# Patient Record
Sex: Male | Born: 1995 | Race: White | Hispanic: No | Marital: Single | Smoking: Never smoker
Health system: Southern US, Community
[De-identification: ages and names within clinical notes are randomized; demographics above are authoritative.]

## PROBLEM LIST (undated history)

## (undated) DIAGNOSIS — M0800 Unspecified juvenile rheumatoid arthritis of unspecified site: Secondary | ICD-10-CM

## (undated) DIAGNOSIS — K589 Irritable bowel syndrome without diarrhea: Secondary | ICD-10-CM

## (undated) HISTORY — PX: NO PAST SURGERIES: SHX2092

---
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
## 2023-10-31 ENCOUNTER — Ambulatory Visit: Payer: Self-pay | Admitting: Cardiology

## 2023-11-06 ENCOUNTER — Ambulatory Visit: Payer: PRIVATE HEALTH INSURANCE | Attending: Cardiology

## 2023-11-06 ENCOUNTER — Encounter: Payer: Self-pay | Admitting: Cardiology

## 2023-11-06 ENCOUNTER — Ambulatory Visit (INDEPENDENT_AMBULATORY_CARE_PROVIDER_SITE_OTHER): Payer: PRIVATE HEALTH INSURANCE

## 2023-11-06 ENCOUNTER — Ambulatory Visit: Payer: PRIVATE HEALTH INSURANCE | Attending: Cardiology | Admitting: Cardiology

## 2023-11-06 VITALS — BP 144/90 | HR 98 | Ht 70.0 in | Wt 228.0 lb

## 2023-11-06 DIAGNOSIS — R0683 Snoring: Secondary | ICD-10-CM | POA: Diagnosis not present

## 2023-11-06 DIAGNOSIS — F419 Anxiety disorder, unspecified: Secondary | ICD-10-CM

## 2023-11-06 DIAGNOSIS — R030 Elevated blood-pressure reading, without diagnosis of hypertension: Secondary | ICD-10-CM | POA: Diagnosis not present

## 2023-11-06 DIAGNOSIS — I10 Essential (primary) hypertension: Secondary | ICD-10-CM

## 2023-11-06 DIAGNOSIS — R002 Palpitations: Secondary | ICD-10-CM | POA: Diagnosis not present

## 2023-11-06 DIAGNOSIS — K589 Irritable bowel syndrome without diarrhea: Secondary | ICD-10-CM | POA: Insufficient documentation

## 2023-11-06 DIAGNOSIS — M083 Juvenile rheumatoid polyarthritis (seronegative): Secondary | ICD-10-CM | POA: Insufficient documentation

## 2023-11-06 LAB — ECHOCARDIOGRAM COMPLETE
Area-P 1/2: 3.68 cm2
Height: 70 in
S' Lateral: 3 cm
Weight: 3648 [oz_av]

## 2023-11-06 NOTE — Patient Instructions (Signed)
Medication Instructions:  Your physician recommends that you continue on your current medications as directed. Please refer to the Current Medication list given to you today.  *If you need a refill on your cardiac medications before your next appointment, please call your pharmacy*   Lab Work: Lipid, Lpa- today If you have labs (blood work) drawn today and your tests are completely normal, you will receive your results only by: MyChart Message (if you have MyChart) OR A paper copy in the mail If you have any lab test that is abnormal or we need to change your treatment, we will call you to review the results.   Testing/Procedures: Your physician has requested that you have an echocardiogram. Echocardiography is a painless test that uses sound waves to create images of your heart. It provides your doctor with information about the size and shape of your heart and how well your heart's chambers and valves are working. This procedure takes approximately one hour. There are no restrictions for this procedure. Please do NOT wear cologne, perfume, aftershave, or lotions (deodorant is allowed). Please arrive 15 minutes prior to your appointment time.  Please note: We ask at that you not bring children with you during ultrasound (echo/ vascular) testing. Due to room size and safety concerns, children are not allowed in the ultrasound rooms during exams. Our front office staff cannot provide observation of children in our lobby area while testing is being conducted. An adult accompanying a patient to their appointment will only be allowed in the ultrasound room at the discretion of the ultrasound technician under special circumstances. We apologize for any inconvenience.   24 hour BP monitor   Follow-Up: At [facility], you and your health needs are our priority.  As part of our continuing mission to provide you with exceptional heart care, we have created designated Provider Care Teams.  These  Care Teams include your primary Cardiologist (physician) and Advanced Practice Providers (APPs -  Physician Assistants and Nurse Practitioners) who all work together to provide you with the care you need, when you need it.  We recommend signing up for the patient portal called "MyChart".  Sign up information is provided on this After Visit Summary.  MyChart is used to connect with patients for Virtual Visits (Telemedicine).  Patients are able to view lab/test results, encounter notes, upcoming appointments, etc.  Non-urgent messages can be sent to your provider as well.   To learn more about what you can do with MyChart, go to [email or web address].    Your next appointment:   2 month(s)  The format for your next appointment:   In Person  Provider:   Gypsy Balsam, MD    Other Instructions NA

## 2023-11-06 NOTE — Progress Notes (Signed)
Cardiology Consultation:    Date:  11/06/2023   ID:  Richard Haas, DOB 02-13-1996, MRN [id number]  PCP:  Patient, No Pcp Per  Cardiologist:  Gypsy Balsam, MD   Referring MD: No ref. provider found   Chief Complaint  Patient presents with   Hypertension   Elevated HR    History of Present Illness:    Richard Haas is a 28 y.o. male who is being seen today for the evaluation of elevated blood pressure at the request of No ref. provider found.  Past medical history significant for juvenile rheumatoid arthritis, irritable bowel syndrome, he was noted to have elevated blood pressure interesting anytime he is in the doctor's office he will have elevated blood pressure when he goes home he is wife checked his blood pressure is always good.  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  Otherwise he is asymptomatic he can walk climb stairs with no difficulty denies have any chest pain tightness squeezing pressure burning chest no palpitation dizziness swelling of lower extremities.  He snores some he is wife is present in office during our visit and she participated in decision making.  Does not smoke never did does not have family history of premature coronary artery disease, does not use any diet.  Past Medical History:  Diagnosis Date   IBS (irritable bowel syndrome)    JRA (juvenile rheumatoid arthritis) (HCC)     Past Surgical History:  Procedure Laterality Date   NO PAST SURGERIES      Current Medications: Current Meds  Medication Sig   [DISCONTINUED] doxycycline (DORYX) 100 MG EC tablet Take 100 mg by mouth daily.     [DISCONTINUED] sulfaSALAzine (AZULFIDINE) 500 MG tablet Take 1,500 mg by mouth daily.       Allergies:   Patient has no known allergies.   Social History   Socioeconomic History   Marital status: Single    Spouse name: Not on file   Number of  children: Not on file   Years of education: Not on file   Highest education level: Not on file  Occupational History   Not on file  Tobacco Use   Smoking status: Never   Smokeless tobacco: Not on file  Substance and Sexual Activity   Alcohol use: Never   Drug use: Never   Sexual activity: Yes  Other Topics Concern   Not on file  Social History Narrative   Not on file   Social Drivers of Health   Financial Resource Strain: Not on file  Food Insecurity: Not on file  Transportation Needs: Not on file  Physical Activity: Not on file  Stress: Not on file  Social Connections: Not on file     Family History: The patient's Family history is unknown by patient. ROS:   Please see the history of present illness.    All 14 point review of systems negative except as described per history of present illness.  EKGs/Labs/Other Studies Reviewed:    The following studies were reviewed today:   EKG:       Recent Labs: No results found for requested labs within last 365 days.  Recent Lipid Panel No results found for: "CHOL", "TRIG", "HDL", "CHOLHDL", "VLDL", "LDLCALC", "LDLDIRECT"  Physical Exam:    VS:  BP (!) 144/90 (BP Location: Right Arm, Patient Position: Sitting)   Pulse 98  Ht 5\' 10"  (1.778 m)   Wt 228 lb (103.4 kg)   SpO2 98%   BMI 32.71 kg/m     Wt Readings from Last 3 Encounters:  11/06/23 228 lb (103.4 kg)  11/09/15 177 lb 1.6 oz (80.3 kg) (85%, Z= 1.05)*  08/15/11 138 lb (62.6 kg) (89%, Z= 1.25)*   * Growth percentiles are based on CDC (Boys, 2-20 Years) data.     GEN:  Well nourished, well developed in no acute distress HEENT: Normal NECK: No JVD; No carotid bruits LYMPHATICS: No lymphadenopathy CARDIAC: RRR, no murmurs, no rubs, no gallops RESPIRATORY:  Clear to auscultation without rales, wheezing or rhonchi  ABDOMEN: Soft, non-tender, non-distended MUSCULOSKELETAL:  No edema; No deformity  SKIN: Warm and dry NEUROLOGIC:  Alert and oriented x  3 PSYCHIATRIC:  Normal affect   ASSESSMENT:    1. Elevated blood pressure reading   2. Essential hypertension   3. Palpitations   4. Snoring   5. Anxiety    PLAN:    In order of problems listed above:  Elevated blood pressure reading while in the office.  I suspect whitecoat hypertension, will put 24 hours blood pressure monitor to prove it. Echocardiac will be done to assess left ventricle ejection fraction he does have abnormal EKG with pulmonary disease pattern, left intrafascicular block.  I suspect he may have sleep apnea we will look at the right side function and size also will check pulmonary artery pressure. Cholesterol status unknown will check fasting lipid profile today. We did talk about nonpharmacological way to manage high blood pressure we talked about avoidance of salt exercises on the regular basis and weight loss.   Medication Adjustments/Labs and Tests Ordered: Current medicines are reviewed at length with the patient today.  Concerns regarding medicines are outlined above.  Orders Placed This Encounter  Procedures   EKG 12-Lead   No orders of the defined types were placed in this encounter.   Signed, Georgeanna Lea, MD, [facility]. 11/06/2023 9:25 AM    [facility]

## 2023-11-12 LAB — LIPID PANEL
Chol/HDL Ratio: 4.6 {ratio} (ref 0.0–5.0)
Cholesterol, Total: 201 mg/dL — ABNORMAL HIGH (ref 100–199)
HDL: 44 mg/dL (ref >39)
LDL Chol Calc (NIH): 114 mg/dL — ABNORMAL HIGH (ref 0–99)
Triglycerides: 247 mg/dL — ABNORMAL HIGH (ref 0–149)
VLDL Cholesterol Cal: 43 mg/dL — ABNORMAL HIGH (ref 5–40)

## 2023-11-12 LAB — LIPOPROTEIN A (LPA): Lipoprotein (a): <8.4 nmol/L (ref <75.0)

## 2023-11-13 ENCOUNTER — Telehealth: Payer: Self-pay

## 2023-11-13 NOTE — Telephone Encounter (Signed)
 Patient notified through my chart.

## 2023-11-13 NOTE — Telephone Encounter (Signed)
-----   Message from Gypsy Balsam sent at 11/07/2023 11:15 AM EST ----- Echocardiogram showed preserved left ventricle ejection fraction, overall looks good

## 2023-11-14 ENCOUNTER — Telehealth: Payer: Self-pay

## 2023-11-14 NOTE — Telephone Encounter (Signed)
-----   Message from Ralene Burger sent at 11/14/2023 10:52 AM EST ----- Cholesterol elevated, triglycerides elevated as well.  For now diet and exercise

## 2023-11-14 NOTE — Telephone Encounter (Signed)
 Patient notified through my chart.

## 2024-01-07 ENCOUNTER — Ambulatory Visit: Payer: PRIVATE HEALTH INSURANCE | Attending: Cardiology | Admitting: Cardiology

## 2024-01-07 ENCOUNTER — Encounter: Payer: Self-pay | Admitting: Cardiology

## 2024-01-07 VITALS — BP 160/100 | HR 96 | Ht 70.0 in | Wt 230.0 lb

## 2024-01-07 DIAGNOSIS — F419 Anxiety disorder, unspecified: Secondary | ICD-10-CM

## 2024-01-07 DIAGNOSIS — I10 Essential (primary) hypertension: Secondary | ICD-10-CM | POA: Diagnosis not present

## 2024-01-07 DIAGNOSIS — R002 Palpitations: Secondary | ICD-10-CM

## 2024-01-07 NOTE — Patient Instructions (Signed)
Medication Instructions:  Your physician recommends that you continue on your current medications as directed. Please refer to the Current Medication list given to you today.  *If you need a refill on your cardiac medications before your next appointment, please call your pharmacy*   Lab Work: None Ordered If you have labs (blood work) drawn today and your tests are completely normal, you will receive your results only by: MyChart Message (if you have MyChart) OR A paper copy in the mail If you have any lab test that is abnormal or we need to change your treatment, we will call you to review the results.   Testing/Procedures: None Ordered   Follow-Up: At [facility], you and your health needs are our priority.  As part of our continuing mission to provide you with exceptional heart care, we have created designated Provider Care Teams.  These Care Teams include your primary Cardiologist (physician) and Advanced Practice Providers (APPs -  Physician Assistants and Nurse Practitioners) who all work together to provide you with the care you need, when you need it.  We recommend signing up for the patient portal called "MyChart".  Sign up information is provided on this After Visit Summary.  MyChart is used to connect with patients for Virtual Visits (Telemedicine).  Patients are able to view lab/test results, encounter notes, upcoming appointments, etc.  Non-urgent messages can be sent to your provider as well.   To learn more about what you can do with MyChart, go to [email or web address].    Your next appointment:   As needed  The format for your next appointment:   In Person  Provider:   Robert Krasowski, MD    Other Instructions NA  

## 2024-01-07 NOTE — Progress Notes (Signed)
 Cardiology Office Note:    Date:  01/07/2024   ID:  Richard Haas, DOB 22-Aug-1996, MRN [id number]  PCP:  Patient, No Pcp Per  Cardiologist:  Gypsy Balsam, MD    Referring MD: No ref. provider found   Chief Complaint  Patient presents with   Follow-up    History of Present Illness:    Richard Haas is a 28 y.o. male who was referred to [place] because of elevated blood pressure.  Past medical history significant for juvenile rheumatoid arthritis, irritable bowel syndrome he was not noted to have elevated blood pressure anytime he goes to the doctor.  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  He came to assess and appointing 24 hours blood pressure monitor him which clearly show evidence of whitecoat hypertension, there was elevated blood pressure after he was leaving our office then during the day and during the night blood pressure was perfect.  Echocardiogram showed no evidence of left ventricle hypertrophy, so I think we clearly dealing with whitecoat hypertension.  Past Medical History:  Diagnosis Date   IBS (irritable bowel syndrome)    JRA (juvenile rheumatoid arthritis) (HCC)     Past Surgical History:  Procedure Laterality Date   NO PAST SURGERIES      Current Medications: No outpatient medications have been marked as taking for the 01/07/24 encounter (Office Visit) with Georgeanna Lea, MD.     Allergies:   Patient has no known allergies.   Social History   Socioeconomic History   Marital status: Single    Spouse name: Not on file   Number of children: Not on file   Years of education: Not on file   Highest education level: Not on file  Occupational History   Not on file  Tobacco Use   Smoking status: Never   Smokeless tobacco: Not on file  Substance and Sexual Activity   Alcohol use: Never   Drug use: Never   Sexual activity: Yes  Other Topics Concern   Not on file  Social History Narrative   Not on file    Social Drivers of Health   Financial Resource Strain: Not on file  Food Insecurity: Not on file  Transportation Needs: Not on file  Physical Activity: Not on file  Stress: Not on file  Social Connections: Not on file     Family History: The patient's Family history is unknown by patient. ROS:   Please see the history of present illness.    All 14 point review of systems negative except as described per history of present illness  EKGs/Labs/Other Studies Reviewed:         Recent Labs: No results found for requested labs within last 365 days.  Recent Lipid Panel    Component Value Date/Time   CHOL 201 (H) 11/06/2023 0938   TRIG 247 (H) 11/06/2023 0938   HDL 44 11/06/2023 0938   CHOLHDL 4.6 11/06/2023 0938   LDLCALC 114 (H) 11/06/2023 0938    Physical Exam:    VS:  BP (!) 160/100 (BP Location: Left Arm, Patient Position: Sitting)   Pulse 96   Ht 5\' 10"  (1.778 m)   Wt 230 lb (104.3 kg)   SpO2 99%   BMI 33.00 kg/m     Wt Readings from Last 3 Encounters:  01/07/24 230 lb (104.3 kg)  11/06/23 228 lb (103.4 kg)  11/09/15 177 lb 1.6 oz (80.3 kg) (85%, Z= 1.05)*   *  Growth percentiles are based on CDC (Boys, 2-20 Years) data.     GEN:  Well nourished, well developed in no acute distress HEENT: Normal NECK: No JVD; No carotid bruits LYMPHATICS: No lymphadenopathy CARDIAC: RRR, no murmurs, no rubs, no gallops RESPIRATORY:  Clear to auscultation without rales, wheezing or rhonchi  ABDOMEN: Soft, non-tender, non-distended MUSCULOSKELETAL:  No edema; No deformity  SKIN: Warm and dry LOWER EXTREMITIES: no swelling NEUROLOGIC:  Alert and oriented x 3 PSYCHIATRIC:  Normal affect   ASSESSMENT:    1. Palpitations   2. Anxiety   3. 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 Palpitations denies having  any. Dyslipidemia I did review his K PN which show me LDL 114 HDL 44 triglycerides 247 he already started making changes in his life he cut down salt he also cutting down cholesterol fluid I encouraged him to be more active and lose some weight that should help with his triglycerides. Anxiety which is contributing to this probably responsible for his whitecoat hypertension. He will follow-up with me on an as-needed basis   Medication Adjustments/Labs and Tests Ordered: Current medicines are reviewed at length with the patient today.  Concerns regarding medicines are outlined above.  No orders of the defined types were placed in this encounter.  Medication changes: No orders of the defined types were placed in this encounter.   Signed, Georgeanna Lea, MD, [facility] 01/07/2024 11:46 AM    [facility]
